# Patient Record
Sex: Female | Born: 1955 | Race: Black or African American | Hispanic: No | Smoking: Former smoker
Health system: Southern US, Community
[De-identification: ages and names within clinical notes are randomized; demographics above are authoritative.]

---

## 2007-09-08 ENCOUNTER — Other Ambulatory Visit: Admission: RE | Admit: 2007-09-08 | Discharge: 2007-09-08 | Payer: Self-pay | Admitting: Obstetrics and Gynecology

## 2007-09-29 ENCOUNTER — Encounter: Admission: RE | Admit: 2007-09-29 | Discharge: 2007-09-29 | Payer: Self-pay | Admitting: Family Medicine

## 2007-10-06 ENCOUNTER — Encounter: Admission: RE | Admit: 2007-10-06 | Discharge: 2007-10-06 | Payer: Self-pay | Admitting: Family Medicine

## 2009-06-13 ENCOUNTER — Encounter: Admission: RE | Admit: 2009-06-13 | Discharge: 2009-06-13 | Payer: Self-pay | Admitting: Family Medicine

## 2010-05-29 ENCOUNTER — Other Ambulatory Visit: Admission: RE | Admit: 2010-05-29 | Discharge: 2010-05-29 | Payer: Self-pay | Admitting: Obstetrics and Gynecology

## 2010-05-29 ENCOUNTER — Other Ambulatory Visit
Admission: RE | Admit: 2010-05-29 | Discharge: 2010-05-29 | Payer: Self-pay | Source: Home / Self Care | Admitting: Obstetrics and Gynecology

## 2010-09-16 ENCOUNTER — Encounter: Payer: Self-pay | Admitting: Family Medicine

## 2010-12-29 ENCOUNTER — Emergency Department (HOSPITAL_COMMUNITY)
Admission: EM | Admit: 2010-12-29 | Discharge: 2010-12-29 | Disposition: A | Discharge status: Home / Self Care | Payer: PRIVATE HEALTH INSURANCE | Attending: Emergency Medicine | Admitting: Emergency Medicine

## 2010-12-29 ENCOUNTER — Emergency Department (HOSPITAL_COMMUNITY): Payer: PRIVATE HEALTH INSURANCE

## 2010-12-29 DIAGNOSIS — M79609 Pain in unspecified limb: Secondary | ICD-10-CM | POA: Insufficient documentation

## 2010-12-29 DIAGNOSIS — I10 Essential (primary) hypertension: Secondary | ICD-10-CM | POA: Insufficient documentation

## 2010-12-29 DIAGNOSIS — M546 Pain in thoracic spine: Secondary | ICD-10-CM | POA: Insufficient documentation

## 2010-12-29 DIAGNOSIS — H1130 Conjunctival hemorrhage, unspecified eye: Secondary | ICD-10-CM | POA: Insufficient documentation

## 2011-06-04 ENCOUNTER — Other Ambulatory Visit (HOSPITAL_COMMUNITY)
Admission: RE | Admit: 2011-06-04 | Payer: PRIVATE HEALTH INSURANCE | Source: Ambulatory Visit | Admitting: Obstetrics and Gynecology

## 2011-06-04 ENCOUNTER — Other Ambulatory Visit (HOSPITAL_COMMUNITY)
Admission: RE | Admit: 2011-06-04 | Discharge: 2011-06-04 | Disposition: A | Discharge status: Home / Self Care | Payer: PRIVATE HEALTH INSURANCE | Source: Ambulatory Visit | Attending: Obstetrics and Gynecology | Admitting: Obstetrics and Gynecology

## 2011-06-04 DIAGNOSIS — Z01419 Encounter for gynecological examination (general) (routine) without abnormal findings: Secondary | ICD-10-CM | POA: Insufficient documentation

## 2011-06-07 ENCOUNTER — Other Ambulatory Visit: Payer: Self-pay | Admitting: Obstetrics and Gynecology

## 2011-06-07 DIAGNOSIS — Z1231 Encounter for screening mammogram for malignant neoplasm of breast: Secondary | ICD-10-CM

## 2011-07-09 ENCOUNTER — Ambulatory Visit
Admission: RE | Admit: 2011-07-09 | Discharge: 2011-07-09 | Disposition: A | Discharge status: Home / Self Care | Payer: PRIVATE HEALTH INSURANCE | Source: Ambulatory Visit | Attending: Obstetrics and Gynecology | Admitting: Obstetrics and Gynecology

## 2011-07-09 ENCOUNTER — Ambulatory Visit: Payer: No Typology Code available for payment source

## 2011-07-09 DIAGNOSIS — Z1231 Encounter for screening mammogram for malignant neoplasm of breast: Secondary | ICD-10-CM

## 2012-11-18 ENCOUNTER — Other Ambulatory Visit: Payer: Self-pay

## 2012-11-18 DIAGNOSIS — Z1231 Encounter for screening mammogram for malignant neoplasm of breast: Secondary | ICD-10-CM

## 2012-12-28 ENCOUNTER — Ambulatory Visit
Admission: RE | Admit: 2012-12-28 | Discharge: 2012-12-28 | Disposition: A | Discharge status: Home / Self Care | Payer: BC Managed Care – PPO | Source: Ambulatory Visit

## 2012-12-28 ENCOUNTER — Other Ambulatory Visit: Payer: Self-pay | Admitting: Obstetrics and Gynecology

## 2012-12-28 ENCOUNTER — Other Ambulatory Visit (HOSPITAL_COMMUNITY)
Admission: RE | Admit: 2012-12-28 | Discharge: 2012-12-28 | Disposition: A | Discharge status: Home / Self Care | Payer: BC Managed Care – PPO | Source: Ambulatory Visit | Attending: Obstetrics and Gynecology | Admitting: Obstetrics and Gynecology

## 2012-12-28 DIAGNOSIS — Z1151 Encounter for screening for human papillomavirus (HPV): Secondary | ICD-10-CM | POA: Insufficient documentation

## 2012-12-28 DIAGNOSIS — Z1231 Encounter for screening mammogram for malignant neoplasm of breast: Secondary | ICD-10-CM

## 2012-12-28 DIAGNOSIS — Z01419 Encounter for gynecological examination (general) (routine) without abnormal findings: Secondary | ICD-10-CM | POA: Insufficient documentation

## 2013-12-16 ENCOUNTER — Other Ambulatory Visit: Payer: Self-pay

## 2013-12-16 DIAGNOSIS — Z1231 Encounter for screening mammogram for malignant neoplasm of breast: Secondary | ICD-10-CM

## 2013-12-30 ENCOUNTER — Ambulatory Visit: Payer: BC Managed Care – PPO

## 2013-12-30 ENCOUNTER — Encounter (INDEPENDENT_AMBULATORY_CARE_PROVIDER_SITE_OTHER): Payer: Self-pay

## 2013-12-30 ENCOUNTER — Ambulatory Visit
Admission: RE | Admit: 2013-12-30 | Discharge: 2013-12-30 | Disposition: A | Discharge status: Home / Self Care | Payer: No Typology Code available for payment source | Source: Ambulatory Visit

## 2013-12-30 DIAGNOSIS — Z1231 Encounter for screening mammogram for malignant neoplasm of breast: Secondary | ICD-10-CM

## 2013-12-31 ENCOUNTER — Other Ambulatory Visit: Payer: Self-pay | Admitting: Internal Medicine

## 2013-12-31 DIAGNOSIS — R928 Other abnormal and inconclusive findings on diagnostic imaging of breast: Secondary | ICD-10-CM

## 2014-01-27 ENCOUNTER — Other Ambulatory Visit: Payer: No Typology Code available for payment source

## 2014-01-27 ENCOUNTER — Ambulatory Visit
Admission: RE | Admit: 2014-01-27 | Discharge: 2014-01-27 | Disposition: A | Discharge status: Home / Self Care | Payer: No Typology Code available for payment source | Source: Ambulatory Visit | Attending: Internal Medicine | Admitting: Internal Medicine

## 2014-01-27 DIAGNOSIS — R928 Other abnormal and inconclusive findings on diagnostic imaging of breast: Secondary | ICD-10-CM

## 2014-05-19 ENCOUNTER — Ambulatory Visit
Admission: RE | Admit: 2014-05-19 | Discharge: 2014-05-19 | Disposition: A | Discharge status: Home / Self Care | Payer: No Typology Code available for payment source | Source: Ambulatory Visit | Attending: Internal Medicine | Admitting: Internal Medicine

## 2014-05-19 ENCOUNTER — Other Ambulatory Visit: Payer: Self-pay | Admitting: Internal Medicine

## 2014-05-19 DIAGNOSIS — M25562 Pain in left knee: Secondary | ICD-10-CM

## 2014-05-19 DIAGNOSIS — M542 Cervicalgia: Secondary | ICD-10-CM

## 2015-01-31 ENCOUNTER — Other Ambulatory Visit: Payer: Self-pay

## 2015-01-31 DIAGNOSIS — Z1231 Encounter for screening mammogram for malignant neoplasm of breast: Secondary | ICD-10-CM

## 2015-02-24 ENCOUNTER — Ambulatory Visit: Payer: No Typology Code available for payment source

## 2015-03-28 ENCOUNTER — Ambulatory Visit
Admission: RE | Admit: 2015-03-28 | Discharge: 2015-03-28 | Disposition: A | Discharge status: Home / Self Care | Payer: 59 | Source: Ambulatory Visit

## 2015-03-28 DIAGNOSIS — Z1231 Encounter for screening mammogram for malignant neoplasm of breast: Secondary | ICD-10-CM

## 2016-03-13 ENCOUNTER — Ambulatory Visit
Admission: RE | Admit: 2016-03-13 | Discharge: 2016-03-13 | Disposition: A | Discharge status: Home / Self Care | Payer: BLUE CROSS/BLUE SHIELD | Source: Ambulatory Visit | Attending: Internal Medicine | Admitting: Internal Medicine

## 2016-03-13 ENCOUNTER — Other Ambulatory Visit: Payer: Self-pay | Admitting: Internal Medicine

## 2016-03-13 DIAGNOSIS — R059 Cough, unspecified: Secondary | ICD-10-CM

## 2016-03-13 DIAGNOSIS — R05 Cough: Secondary | ICD-10-CM

## 2016-04-04 ENCOUNTER — Other Ambulatory Visit (HOSPITAL_COMMUNITY)
Admission: RE | Admit: 2016-04-04 | Discharge: 2016-04-04 | Disposition: A | Discharge status: Home / Self Care | Payer: BLUE CROSS/BLUE SHIELD | Source: Ambulatory Visit | Attending: Nurse Practitioner | Admitting: Nurse Practitioner

## 2016-04-04 ENCOUNTER — Other Ambulatory Visit: Payer: Self-pay | Admitting: Nurse Practitioner

## 2016-04-04 DIAGNOSIS — Z1151 Encounter for screening for human papillomavirus (HPV): Secondary | ICD-10-CM | POA: Insufficient documentation

## 2016-04-04 DIAGNOSIS — Z01419 Encounter for gynecological examination (general) (routine) without abnormal findings: Secondary | ICD-10-CM | POA: Diagnosis present

## 2016-04-05 LAB — CYTOLOGY - PAP

## 2016-10-25 ENCOUNTER — Other Ambulatory Visit: Payer: Self-pay | Admitting: Nurse Practitioner

## 2016-10-25 ENCOUNTER — Other Ambulatory Visit: Payer: Self-pay | Admitting: Internal Medicine

## 2016-10-25 DIAGNOSIS — N63 Unspecified lump in unspecified breast: Secondary | ICD-10-CM

## 2016-10-25 DIAGNOSIS — R5381 Other malaise: Secondary | ICD-10-CM

## 2016-11-05 ENCOUNTER — Ambulatory Visit
Admission: RE | Admit: 2016-11-05 | Discharge: 2016-11-05 | Disposition: A | Discharge status: Home / Self Care | Payer: BLUE CROSS/BLUE SHIELD | Source: Ambulatory Visit | Attending: Nurse Practitioner | Admitting: Nurse Practitioner

## 2016-11-05 DIAGNOSIS — N63 Unspecified lump in unspecified breast: Secondary | ICD-10-CM

## 2017-07-24 ENCOUNTER — Other Ambulatory Visit: Payer: Self-pay | Admitting: Nurse Practitioner

## 2018-01-19 IMAGING — CR DG CHEST 2V
2 series · 2 of 2 positions shown · non-contrast
Comparison: Chest x-ray of February 05, 2011

CLINICAL DATA: Persistent nonproductive cough with occasional
production of phlegm for the past 2 months, current smoker, history
of hypertension.

EXAM:
CHEST  2 VIEW

[view not recorded (1 of 2)]
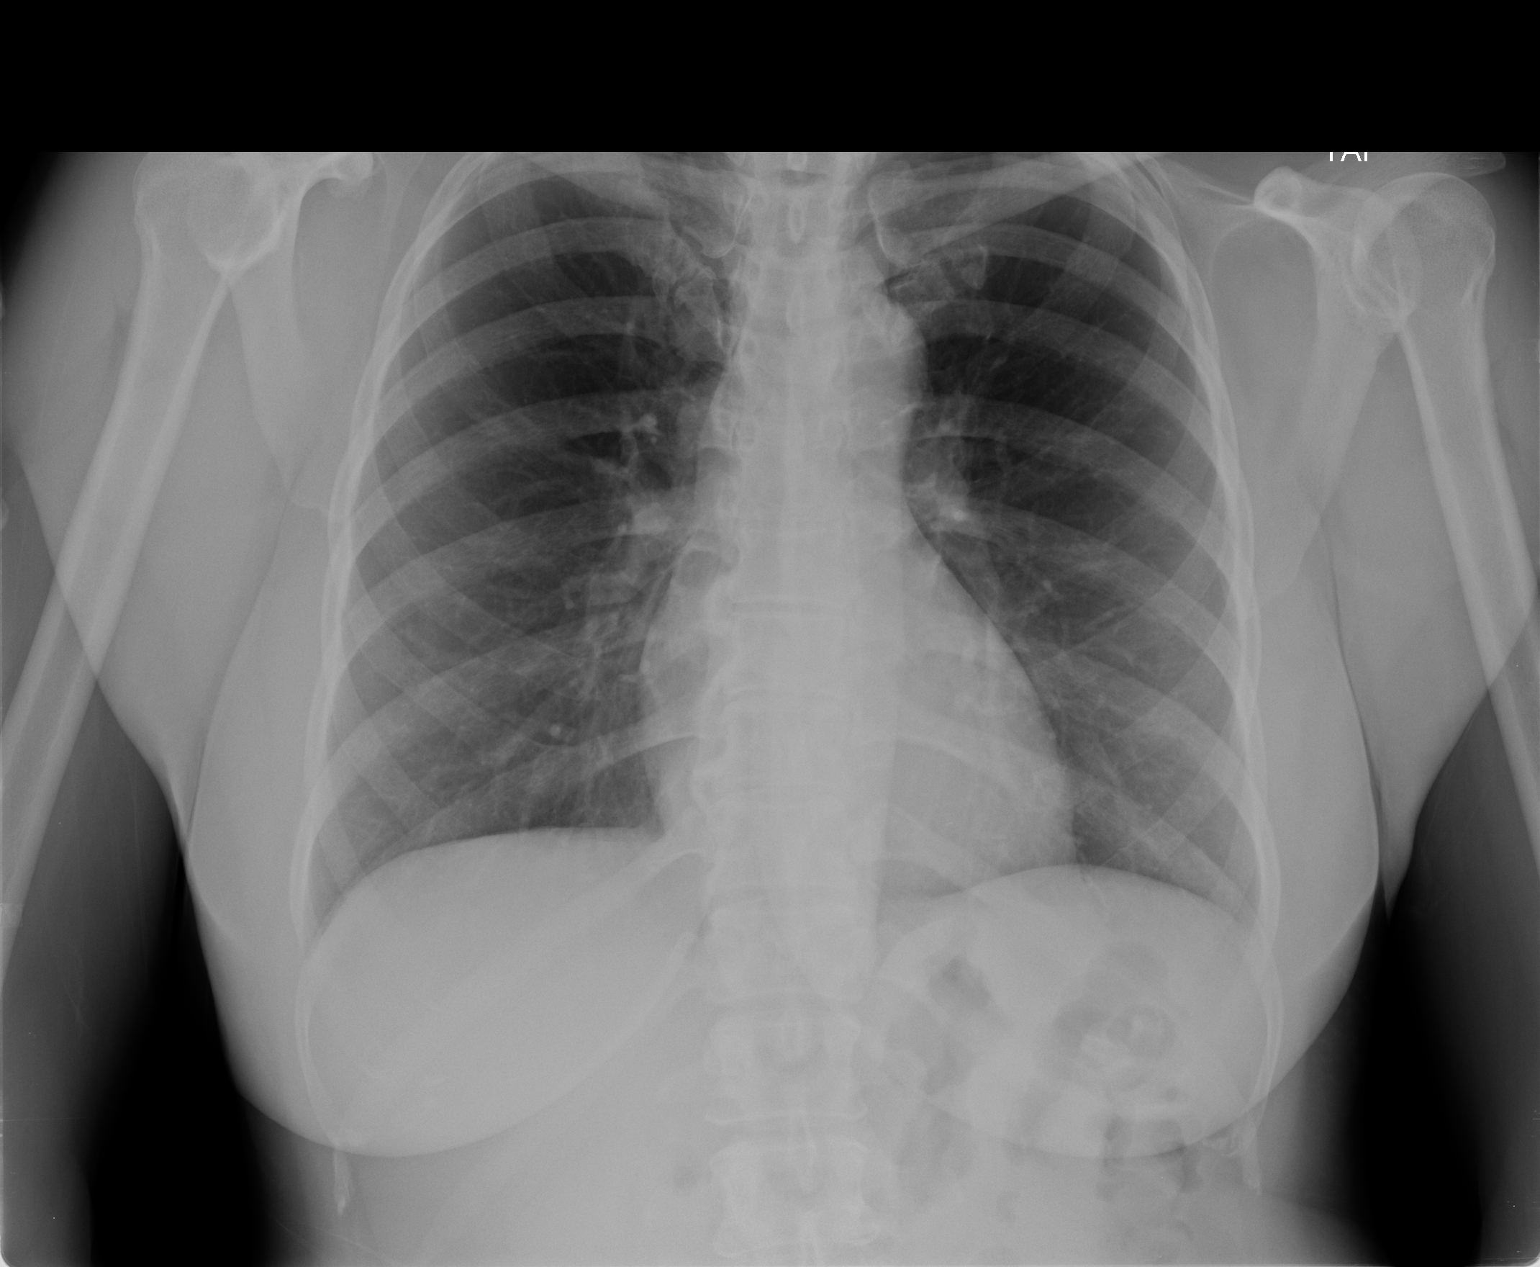

[view not recorded (2 of 2)]
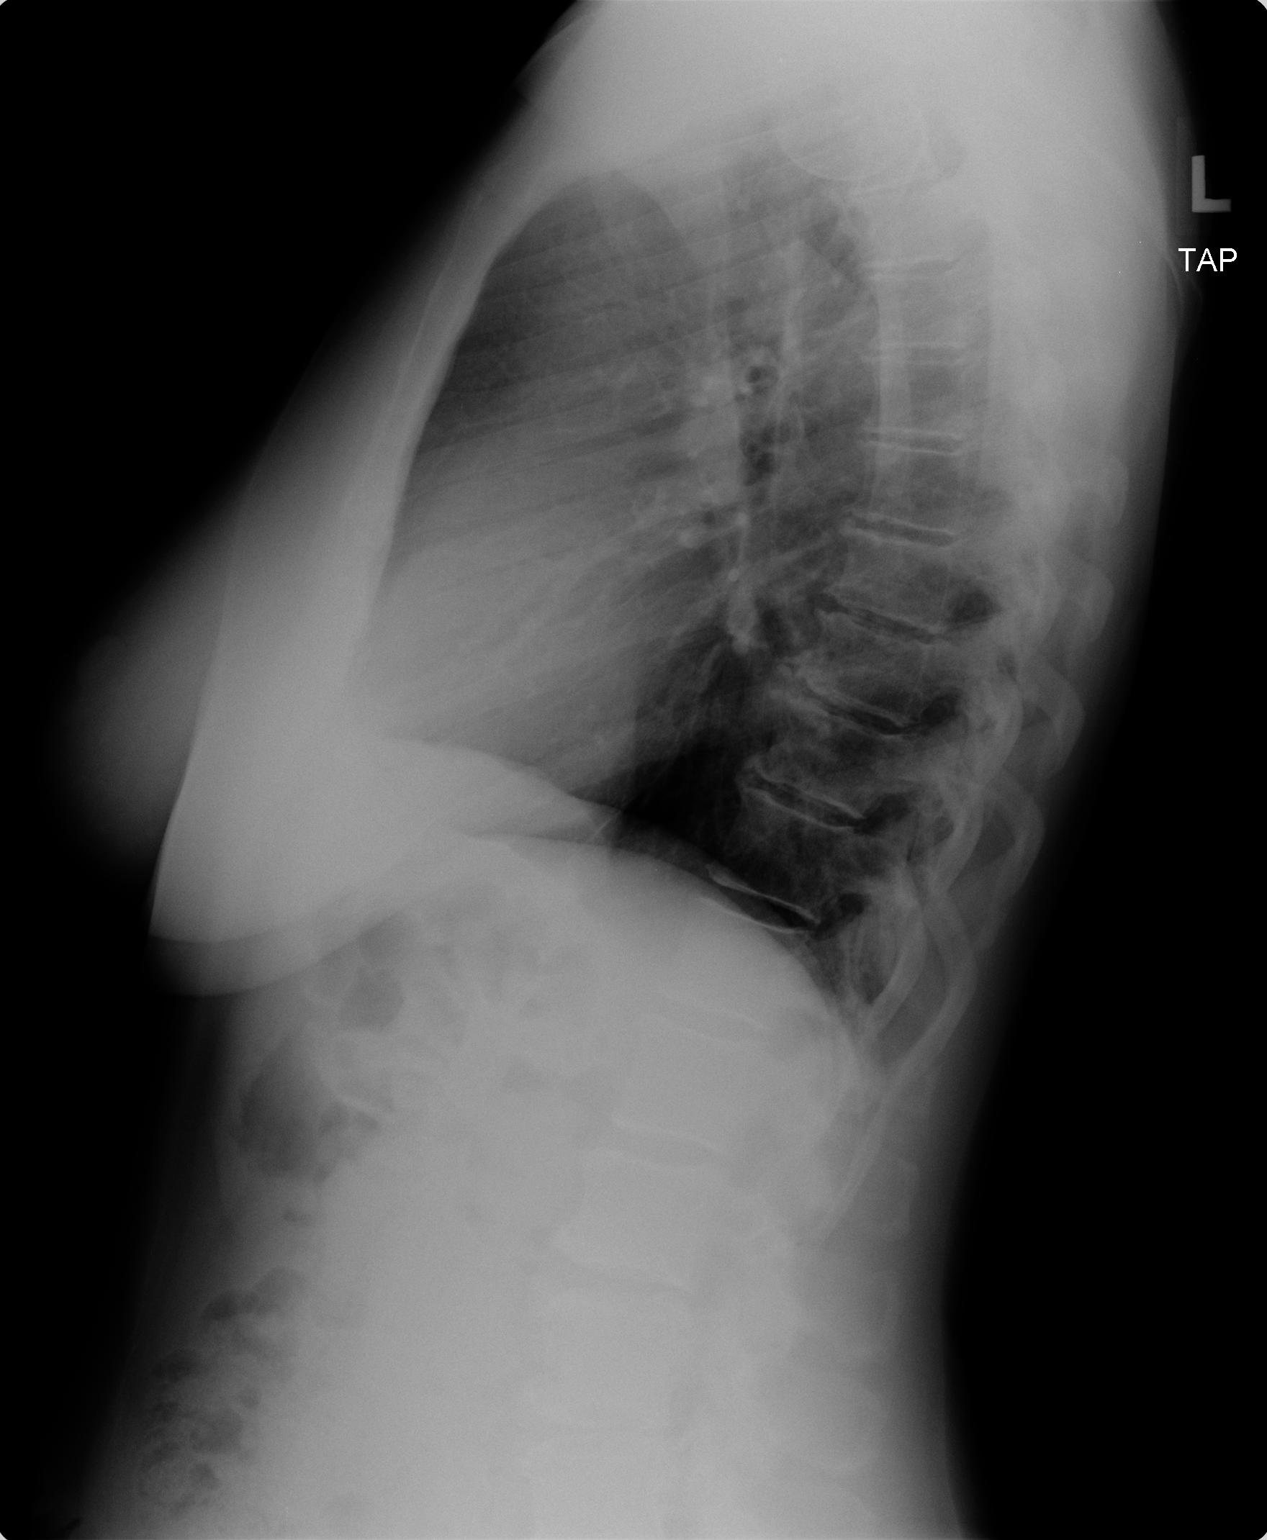

[2 of 2 positions shown; findings below may reference images not displayed]

FINDINGS: The lungs are mildly hyperinflated. There is no focal infiltrate.
The heart and pulmonary vascularity are normal. The mediastinum is
normal in width. There is no pleural effusion. There is
calcification in the wall of the thoracic aorta. The bony thorax is
unremarkable.
IMPRESSION: There is no pneumonia nor other acute cardiopulmonary abnormality.

Aortic atherosclerosis.

## 2018-03-24 ENCOUNTER — Other Ambulatory Visit: Payer: Self-pay | Admitting: Internal Medicine

## 2018-03-24 DIAGNOSIS — N6320 Unspecified lump in the left breast, unspecified quadrant: Secondary | ICD-10-CM

## 2018-03-27 ENCOUNTER — Other Ambulatory Visit: Payer: BLUE CROSS/BLUE SHIELD

## 2018-04-03 ENCOUNTER — Ambulatory Visit
Admission: RE | Admit: 2018-04-03 | Discharge: 2018-04-03 | Disposition: A | Discharge status: Home / Self Care | Payer: BLUE CROSS/BLUE SHIELD | Source: Ambulatory Visit | Attending: Internal Medicine | Admitting: Internal Medicine

## 2018-04-03 DIAGNOSIS — N6320 Unspecified lump in the left breast, unspecified quadrant: Secondary | ICD-10-CM

## 2018-08-04 ENCOUNTER — Other Ambulatory Visit (HOSPITAL_COMMUNITY)
Admission: RE | Admit: 2018-08-04 | Discharge: 2018-08-04 | Disposition: A | Discharge status: Home / Self Care | Payer: BLUE CROSS/BLUE SHIELD | Source: Ambulatory Visit | Attending: Nurse Practitioner | Admitting: Nurse Practitioner

## 2018-08-04 ENCOUNTER — Other Ambulatory Visit: Payer: Self-pay | Admitting: Nurse Practitioner

## 2018-08-04 DIAGNOSIS — Z01419 Encounter for gynecological examination (general) (routine) without abnormal findings: Secondary | ICD-10-CM | POA: Diagnosis present

## 2018-08-06 LAB — CYTOLOGY - PAP
Diagnosis: NEGATIVE
HPV: NOT DETECTED

## 2019-09-14 ENCOUNTER — Other Ambulatory Visit: Payer: Self-pay | Admitting: Internal Medicine

## 2019-09-14 DIAGNOSIS — Z1231 Encounter for screening mammogram for malignant neoplasm of breast: Secondary | ICD-10-CM

## 2019-10-13 ENCOUNTER — Ambulatory Visit: Payer: BLUE CROSS/BLUE SHIELD

## 2019-11-12 ENCOUNTER — Ambulatory Visit: Payer: Self-pay | Attending: Internal Medicine

## 2019-11-12 DIAGNOSIS — Z23 Encounter for immunization: Secondary | ICD-10-CM

## 2019-11-12 NOTE — Progress Notes (Signed)
   Covid-19 Vaccination Clinic  Name:  Kristen Todd    MRN: [id number] DOB: Feb 17, 1956  11/12/2019  Ms. [name] was observed post Covid-19 immunization for 15 minutes without incident. She was provided with Vaccine Information Sheet and instruction to access the V-Safe system.   Ms. [name] was instructed to call 911 with any severe reactions post vaccine: Marland Kitchen Difficulty breathing  . Swelling of face and throat  . A fast heartbeat  . A bad rash all over body  . Dizziness and weakness   Immunizations Administered    Name Date Dose VIS Date Route   Pfizer COVID-19 Vaccine 11/12/2019 11:30 AM 0.3 mL 08/06/2019 Intramuscular   Manufacturer: [place]   Lot: [id number]   [place]: [id number]

## 2019-11-20 ENCOUNTER — Ambulatory Visit: Payer: Self-pay

## 2019-12-08 ENCOUNTER — Ambulatory Visit: Payer: Self-pay | Attending: Internal Medicine

## 2019-12-08 DIAGNOSIS — Z23 Encounter for immunization: Secondary | ICD-10-CM

## 2019-12-08 NOTE — Progress Notes (Signed)
   Covid-19 Vaccination Clinic  Name:  Kristen Todd    MRN: [id number] DOB: [birth year]  12/08/2019  [name] was observed post Covid-19 immunization for 15 minutes without incident. She was provided with Vaccine Information Sheet and instruction to access the V-Safe system.   [name] was instructed to call 911 with any severe reactions post vaccine: Marland Kitchen Difficulty breathing  . Swelling of face and throat  . A fast heartbeat  . A bad rash all over body  . Dizziness and weakness   Immunizations Administered    Name Date Dose VIS Date Route   Pfizer COVID-19 Vaccine 12/08/2019  9:50 AM 0.3 mL 08/06/2019 Intramuscular   Manufacturer: [place]   Lot: [id number]   [place]: [id number]

## 2020-07-25 ENCOUNTER — Ambulatory Visit: Payer: Self-pay | Attending: Internal Medicine

## 2020-07-25 DIAGNOSIS — Z23 Encounter for immunization: Secondary | ICD-10-CM

## 2020-07-25 NOTE — Progress Notes (Signed)
   Covid-19 Vaccination Clinic  Name:  Kristen Todd    MRN: [id number] DOB: 04/23/1956  07/25/2020  [name] was observed post Covid-19 immunization for 15 minutes without incident. She was provided with Vaccine Information Sheet and instruction to access the V-Safe system.   [name] was instructed to call 911 with any severe reactions post vaccine: Marland Kitchen Difficulty breathing  . Swelling of face and throat  . A fast heartbeat  . A bad rash all over body  . Dizziness and weakness   Immunizations Administered    Name Date Dose VIS Date Route   Pfizer COVID-19 Vaccine 07/25/2020  3:04 PM 0.3 mL 06/14/2020 Intramuscular   Manufacturer: [place]   Lot: [id number]   [place]: [id number]

## 2020-09-14 ENCOUNTER — Other Ambulatory Visit: Payer: Self-pay

## 2020-09-14 ENCOUNTER — Ambulatory Visit
Admission: RE | Admit: 2020-09-14 | Discharge: 2020-09-14 | Disposition: A | Discharge status: Home / Self Care | Payer: Self-pay | Source: Ambulatory Visit | Attending: Internal Medicine | Admitting: Internal Medicine

## 2020-09-14 DIAGNOSIS — Z1231 Encounter for screening mammogram for malignant neoplasm of breast: Secondary | ICD-10-CM

## 2021-03-30 ENCOUNTER — Other Ambulatory Visit: Payer: Self-pay | Admitting: Physician Assistant

## 2021-03-30 DIAGNOSIS — R1310 Dysphagia, unspecified: Secondary | ICD-10-CM

## 2021-04-09 ENCOUNTER — Other Ambulatory Visit: Payer: 59

## 2021-04-10 ENCOUNTER — Ambulatory Visit
Admission: RE | Admit: 2021-04-10 | Discharge: 2021-04-10 | Disposition: A | Discharge status: Home / Self Care | Payer: 59 | Source: Ambulatory Visit | Attending: Physician Assistant | Admitting: Physician Assistant

## 2021-04-10 ENCOUNTER — Other Ambulatory Visit: Payer: Self-pay

## 2021-04-10 ENCOUNTER — Other Ambulatory Visit: Payer: Self-pay | Admitting: Physician Assistant

## 2021-04-10 DIAGNOSIS — R1310 Dysphagia, unspecified: Secondary | ICD-10-CM

## 2021-10-04 DIAGNOSIS — E782 Mixed hyperlipidemia: Secondary | ICD-10-CM | POA: Diagnosis not present

## 2021-10-04 DIAGNOSIS — Z5181 Encounter for therapeutic drug level monitoring: Secondary | ICD-10-CM | POA: Diagnosis not present

## 2021-10-11 ENCOUNTER — Other Ambulatory Visit: Payer: Self-pay | Admitting: Internal Medicine

## 2021-10-11 DIAGNOSIS — Z1231 Encounter for screening mammogram for malignant neoplasm of breast: Secondary | ICD-10-CM

## 2021-10-30 ENCOUNTER — Ambulatory Visit
Admission: RE | Admit: 2021-10-30 | Discharge: 2021-10-30 | Disposition: A | Discharge status: Home / Self Care | Payer: 59 | Source: Ambulatory Visit | Attending: Internal Medicine | Admitting: Internal Medicine

## 2021-10-30 ENCOUNTER — Ambulatory Visit: Payer: 59 | Admitting: Podiatry

## 2021-10-30 ENCOUNTER — Ambulatory Visit (INDEPENDENT_AMBULATORY_CARE_PROVIDER_SITE_OTHER): Payer: 59

## 2021-10-30 ENCOUNTER — Other Ambulatory Visit: Payer: Self-pay

## 2021-10-30 ENCOUNTER — Encounter: Payer: Self-pay | Admitting: Podiatry

## 2021-10-30 DIAGNOSIS — Z1231 Encounter for screening mammogram for malignant neoplasm of breast: Secondary | ICD-10-CM | POA: Diagnosis not present

## 2021-10-30 DIAGNOSIS — R1310 Dysphagia, unspecified: Secondary | ICD-10-CM | POA: Insufficient documentation

## 2021-10-30 DIAGNOSIS — N951 Menopausal and female climacteric states: Secondary | ICD-10-CM | POA: Insufficient documentation

## 2021-10-30 DIAGNOSIS — M2011 Hallux valgus (acquired), right foot: Secondary | ICD-10-CM | POA: Diagnosis not present

## 2021-10-30 DIAGNOSIS — F418 Other specified anxiety disorders: Secondary | ICD-10-CM | POA: Insufficient documentation

## 2021-10-30 DIAGNOSIS — D2371 Other benign neoplasm of skin of right lower limb, including hip: Secondary | ICD-10-CM | POA: Diagnosis not present

## 2021-10-30 DIAGNOSIS — K5730 Diverticulosis of large intestine without perforation or abscess without bleeding: Secondary | ICD-10-CM | POA: Insufficient documentation

## 2021-10-30 DIAGNOSIS — J309 Allergic rhinitis, unspecified: Secondary | ICD-10-CM | POA: Insufficient documentation

## 2021-10-30 DIAGNOSIS — M2041 Other hammer toe(s) (acquired), right foot: Secondary | ICD-10-CM

## 2021-10-30 DIAGNOSIS — N950 Postmenopausal bleeding: Secondary | ICD-10-CM | POA: Insufficient documentation

## 2021-10-30 DIAGNOSIS — N182 Chronic kidney disease, stage 2 (mild): Secondary | ICD-10-CM | POA: Insufficient documentation

## 2021-10-30 DIAGNOSIS — E782 Mixed hyperlipidemia: Secondary | ICD-10-CM | POA: Insufficient documentation

## 2021-10-30 DIAGNOSIS — M21619 Bunion of unspecified foot: Secondary | ICD-10-CM | POA: Insufficient documentation

## 2021-10-30 DIAGNOSIS — I10 Essential (primary) hypertension: Secondary | ICD-10-CM | POA: Insufficient documentation

## 2021-10-30 DIAGNOSIS — Z1211 Encounter for screening for malignant neoplasm of colon: Secondary | ICD-10-CM | POA: Insufficient documentation

## 2021-10-30 DIAGNOSIS — E7800 Pure hypercholesterolemia, unspecified: Secondary | ICD-10-CM | POA: Insufficient documentation

## 2021-10-30 DIAGNOSIS — F329 Major depressive disorder, single episode, unspecified: Secondary | ICD-10-CM | POA: Insufficient documentation

## 2021-10-30 DIAGNOSIS — D259 Leiomyoma of uterus, unspecified: Secondary | ICD-10-CM | POA: Insufficient documentation

## 2021-10-30 DIAGNOSIS — N926 Irregular menstruation, unspecified: Secondary | ICD-10-CM | POA: Insufficient documentation

## 2021-10-30 NOTE — Progress Notes (Signed)
?Subjective:  ?Patient ID: Kristen Todd, female    DOB: 10-Jun-1956,  MRN: [id number] ?HPI ?Chief Complaint  ?Patient presents with  ? Foot Pain  ?  1st MPJ and 2nd toe right - bunion and HT deformity x years, developing calluses from rubbing other toes and shoe gear  ? New Patient (Initial Visit)  ? ? ?66 y.o. female presents with the above complaint.  ? ?ROS: Denies fever chills nausea vomiting muscle aches pains calf pain back pain chest pain shortness of breath. ? ?No past medical history on file. ?No past surgical history on file. ? ?Current Outpatient Medications:  ?  hydrocortisone (ANUSOL-HC) 2.5 % rectal cream, 1 application, Disp: , Rfl:  ?  ALPRAZolam (XANAX) 0.5 MG tablet, Take 0.5 mg by mouth at bedtime as needed., Disp: , Rfl:  ?  amLODipine (NORVASC) 10 MG tablet, Take 10 mg by mouth daily., Disp: , Rfl:  ?  atorvastatin (LIPITOR) 10 MG tablet, Take 10 mg by mouth daily., Disp: , Rfl:  ?  Flaxseed, Linseed, (FLAX SEED OIL) 1000 MG CAPS, See admin instructions., Disp: , Rfl:  ?  levocetirizine (XYZAL) 5 MG tablet, every evening., Disp: , Rfl:  ?  Magnesium 200 MG TABS, 1 tablet, Disp: , Rfl:  ?  MIMVEY 1-0.5 MG tablet, Take 1 tablet by mouth daily., Disp: , Rfl:  ?  Omega-3 Fatty Acids (FISH OIL) 1000 MG CAPS, 1 capsule, Disp: , Rfl:  ?  vitamin C (ASCORBIC ACID) 500 MG tablet, 1 tablet, Disp: , Rfl:  ? ?Allergies  ?Allergen Reactions  ? Latex   ?  Other reaction(s): itching  ? ?Review of Systems ?Objective:  ?There were no vitals filed for this visit. ? ?General: Well developed, nourished, in no acute distress, alert and oriented x3  ? ?Dermatological: Skin is warm, dry and supple bilateral. Nails x 10 are well maintained; remaining integument appears unremarkable at this time. There are no open sores, no preulcerative lesions, no rash or signs of infection present. ? ?Vascular: Dorsalis Pedis artery and Posterior Tibial artery pedal pulses are 2/4 bilateral with immedate capillary fill time. Pedal  hair growth present. No varicosities and no lower extremity edema present bilateral.  ? ?Neruologic: Grossly intact via light touch bilateral. Vibratory intact via tuning fork bilateral. Protective threshold with Semmes Wienstein monofilament intact to all pedal sites bilateral. Patellar and Achilles deep tendon reflexes 2+ bilateral. No Babinski or clonus noted bilateral.  ? ?Musculoskeletal: No gross boney pedal deformities bilateral. No pain, crepitus, or limitation noted with foot and ankle range of motion bilateral. Muscular strength 5/5 in all groups tested bilateral.  He has mild hallux valgus of the right foot and a painful PIPJ second digit of the right foot secondary to previous surgery and arthritis.  There is considerable's scarring around t the first metatarsal phalangeal joint area. ? ?Gait: Unassisted, Nonantalgic.  ? ? ?Radiographs: ? ?Radiographs of the right foot taken today demonstrate an osseously mature individual with significant osteoarthritic changes cystic and degenerative around the first metatarsal phalangeal joint interphalangeal joint and PIPJ of the second digit right foot.  No fractures identified no acute findings.   ? ?Assessment & Plan:  ? 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 ?Plan: Discussed etiology pathology conservative surgical therapies with her today we did not consent her today she is going to think about this.  I did discuss possible complications associated with the multiple surgeries that are associated  with this foot and that have been in the past.  I explained to her that I would worry about healing of not only the bone bit of the skin and possible loss of digit or limb.  I will follow-up with her in the future for further discussion ? ? ? ? ?Hiawatha Dressel T. [place], DPM ?

## 2021-10-31 ENCOUNTER — Other Ambulatory Visit: Payer: Self-pay | Admitting: Internal Medicine

## 2021-10-31 DIAGNOSIS — R928 Other abnormal and inconclusive findings on diagnostic imaging of breast: Secondary | ICD-10-CM

## 2022-01-02 ENCOUNTER — Other Ambulatory Visit: Payer: 59

## 2022-02-18 DIAGNOSIS — Z79890 Hormone replacement therapy (postmenopausal): Secondary | ICD-10-CM | POA: Diagnosis not present

## 2022-02-18 DIAGNOSIS — Z124 Encounter for screening for malignant neoplasm of cervix: Secondary | ICD-10-CM | POA: Diagnosis not present

## 2022-02-18 DIAGNOSIS — R69 Illness, unspecified: Secondary | ICD-10-CM | POA: Diagnosis not present

## 2022-02-18 DIAGNOSIS — Z01419 Encounter for gynecological examination (general) (routine) without abnormal findings: Secondary | ICD-10-CM | POA: Diagnosis not present

## 2023-03-18 DIAGNOSIS — Z01419 Encounter for gynecological examination (general) (routine) without abnormal findings: Secondary | ICD-10-CM | POA: Diagnosis not present

## 2023-03-18 DIAGNOSIS — Z1151 Encounter for screening for human papillomavirus (HPV): Secondary | ICD-10-CM | POA: Diagnosis not present

## 2023-03-18 DIAGNOSIS — Z124 Encounter for screening for malignant neoplasm of cervix: Secondary | ICD-10-CM | POA: Diagnosis not present

## 2023-06-09 ENCOUNTER — Other Ambulatory Visit: Payer: Self-pay | Admitting: Internal Medicine

## 2023-06-09 ENCOUNTER — Ambulatory Visit
Admission: RE | Admit: 2023-06-09 | Discharge: 2023-06-09 | Disposition: A | Discharge status: Home / Self Care | Payer: Medicare HMO | Source: Ambulatory Visit | Attending: Internal Medicine | Admitting: Internal Medicine

## 2023-06-09 DIAGNOSIS — Z23 Encounter for immunization: Secondary | ICD-10-CM | POA: Diagnosis not present

## 2023-06-09 DIAGNOSIS — R059 Cough, unspecified: Secondary | ICD-10-CM | POA: Diagnosis not present

## 2023-06-09 DIAGNOSIS — J309 Allergic rhinitis, unspecified: Secondary | ICD-10-CM | POA: Diagnosis not present

## 2023-06-09 DIAGNOSIS — K219 Gastro-esophageal reflux disease without esophagitis: Secondary | ICD-10-CM | POA: Diagnosis not present

## 2023-06-09 DIAGNOSIS — N1831 Chronic kidney disease, stage 3a: Secondary | ICD-10-CM | POA: Diagnosis not present

## 2023-06-24 DIAGNOSIS — H5203 Hypermetropia, bilateral: Secondary | ICD-10-CM | POA: Diagnosis not present

## 2023-06-24 DIAGNOSIS — H524 Presbyopia: Secondary | ICD-10-CM | POA: Diagnosis not present

## 2023-06-24 DIAGNOSIS — H2511 Age-related nuclear cataract, right eye: Secondary | ICD-10-CM | POA: Diagnosis not present

## 2023-06-24 DIAGNOSIS — H52209 Unspecified astigmatism, unspecified eye: Secondary | ICD-10-CM | POA: Diagnosis not present

## 2023-06-24 DIAGNOSIS — H2512 Age-related nuclear cataract, left eye: Secondary | ICD-10-CM | POA: Diagnosis not present

## 2023-10-14 ENCOUNTER — Other Ambulatory Visit: Payer: Self-pay | Admitting: Internal Medicine

## 2023-10-14 DIAGNOSIS — Z0000 Encounter for general adult medical examination without abnormal findings: Secondary | ICD-10-CM | POA: Diagnosis not present

## 2023-10-14 DIAGNOSIS — E2839 Other primary ovarian failure: Secondary | ICD-10-CM

## 2023-10-14 DIAGNOSIS — M2031 Hallux varus (acquired), right foot: Secondary | ICD-10-CM | POA: Diagnosis not present

## 2023-10-14 DIAGNOSIS — Z1389 Encounter for screening for other disorder: Secondary | ICD-10-CM | POA: Diagnosis not present

## 2023-10-14 DIAGNOSIS — I10 Essential (primary) hypertension: Secondary | ICD-10-CM | POA: Diagnosis not present

## 2023-10-14 DIAGNOSIS — Z23 Encounter for immunization: Secondary | ICD-10-CM | POA: Diagnosis not present

## 2023-10-14 DIAGNOSIS — F325 Major depressive disorder, single episode, in full remission: Secondary | ICD-10-CM | POA: Diagnosis not present

## 2023-10-14 DIAGNOSIS — N1831 Chronic kidney disease, stage 3a: Secondary | ICD-10-CM | POA: Diagnosis not present

## 2023-10-14 DIAGNOSIS — E782 Mixed hyperlipidemia: Secondary | ICD-10-CM | POA: Diagnosis not present

## 2023-10-21 ENCOUNTER — Other Ambulatory Visit: Payer: Self-pay | Admitting: Internal Medicine

## 2023-10-21 DIAGNOSIS — N6489 Other specified disorders of breast: Secondary | ICD-10-CM

## 2023-10-29 ENCOUNTER — Other Ambulatory Visit: Payer: Self-pay | Admitting: Internal Medicine

## 2023-10-29 DIAGNOSIS — R928 Other abnormal and inconclusive findings on diagnostic imaging of breast: Secondary | ICD-10-CM

## 2023-11-03 ENCOUNTER — Other Ambulatory Visit: Payer: Medicare HMO

## 2023-11-24 ENCOUNTER — Ambulatory Visit
Admission: RE | Admit: 2023-11-24 | Discharge: 2023-11-24 | Disposition: A | Discharge status: Home / Self Care | Source: Ambulatory Visit | Attending: Internal Medicine | Admitting: Internal Medicine

## 2023-11-24 ENCOUNTER — Ambulatory Visit

## 2023-11-24 DIAGNOSIS — R928 Other abnormal and inconclusive findings on diagnostic imaging of breast: Secondary | ICD-10-CM | POA: Diagnosis not present

## 2024-01-15 DIAGNOSIS — L639 Alopecia areata, unspecified: Secondary | ICD-10-CM | POA: Diagnosis not present

## 2024-01-15 DIAGNOSIS — L659 Nonscarring hair loss, unspecified: Secondary | ICD-10-CM | POA: Diagnosis not present

## 2024-02-10 DIAGNOSIS — L738 Other specified follicular disorders: Secondary | ICD-10-CM | POA: Diagnosis not present

## 2024-03-09 DIAGNOSIS — L509 Urticaria, unspecified: Secondary | ICD-10-CM | POA: Diagnosis not present

## 2024-03-09 DIAGNOSIS — L738 Other specified follicular disorders: Secondary | ICD-10-CM | POA: Diagnosis not present

## 2024-03-19 ENCOUNTER — Ambulatory Visit: Admitting: Podiatry

## 2024-03-19 ENCOUNTER — Ambulatory Visit (INDEPENDENT_AMBULATORY_CARE_PROVIDER_SITE_OTHER)

## 2024-03-19 ENCOUNTER — Encounter: Payer: Self-pay | Admitting: Podiatry

## 2024-03-19 DIAGNOSIS — M21619 Bunion of unspecified foot: Secondary | ICD-10-CM

## 2024-03-19 DIAGNOSIS — M2041 Other hammer toe(s) (acquired), right foot: Secondary | ICD-10-CM | POA: Diagnosis not present

## 2024-03-19 DIAGNOSIS — M2042 Other hammer toe(s) (acquired), left foot: Secondary | ICD-10-CM | POA: Diagnosis not present

## 2024-03-19 DIAGNOSIS — M21611 Bunion of right foot: Secondary | ICD-10-CM

## 2024-03-21 NOTE — Progress Notes (Signed)
 Subjective:   Patient ID: Kristen Todd, female   DOB: 68 y.o.   MRN: [id number]   HPI Patient states she is developing more pain in her right foot after having had surgery done approximately 20 years ago and patient desires correction of deformity as it is becoming harder to wear shoe gear with any degree of comfort.  Patient has no changes in health history   ROS      Objective:  Physical Exam  Neurovascular status was found to be intact with excellent pulses bilateral warm digits good digital perfusion noted.  Patient is noted to have an elevated second digit right foot that is elongated and becoming more tender for her with history of the left 1 having been corrected and having also had bunion deformity corrected bilateral with moderate reoccurrence on the right with deviation of the big toe against the second toe causing it to elevate.  Patient is also noted to have lesion on the fifth digit right foot painful when pressed     Assessment:  HAV deformity right with prominent bump and history of surgery with deviated right hallux and elevated second toe rotated fifth toe with keratotic lesion all painful.  Patient has tried wider shoes she has tried trimming techniques and she has tried cushions     Plan:  Reviewed x-rays and condition at great length.  Discussed alternatives for this and at this point patient  is motivated for surgery.  I have recommended due to the fact that previous surgery seemed to cause some damage to the first metatarsal head and she does have good range of motion of it with no crepitus that we just reduce the medial eminence which is sore and we released the abductor tendon I will then do Aiken osteotomy to straighten the big toe shortening and straighten the second toe and I will de rotate the fifth digit.  I explained all procedures and risk and patient wants surgery.  He read consent form going over alternative treatments complications and is scheduled for  outpatient surgery at this time for the above condition with all questions answered and encouraged to call with any questions.  I also dispensed air fracture walker to immobilize and take all pressure off the foot  X-rays indicate that there is abnormal seeing the first metatarsal head right but it does seem to function well with the medial eminence on the side.  Elongated second toe right that is also elevated and rotated fifth digit and big toe pressing against the second toe with structural hallux interphalange ES

## 2024-03-22 ENCOUNTER — Telehealth: Payer: Self-pay | Admitting: Podiatry

## 2024-03-22 NOTE — Telephone Encounter (Signed)
 Pt is scheduled for surgery on 9/9 per her request.  I did verify and change pharmacy to correct one. She is not on any blood thinners or GLP1 medications.  She would like you to call her as she has some questions about the procedure she is having.

## 2024-03-22 NOTE — Telephone Encounter (Signed)
 Pt left message asking for a call back to get her surgery scheduled.  I returned call and she was asking for surgery on a Monday and I explained Dr Magdalen does his surgeries on Tuesdays. She is calling her transportation and will call me back to get scheduled.

## 2024-03-24 NOTE — Telephone Encounter (Signed)
 done

## 2024-03-25 DIAGNOSIS — Z01419 Encounter for gynecological examination (general) (routine) without abnormal findings: Secondary | ICD-10-CM | POA: Diagnosis not present

## 2024-04-06 DIAGNOSIS — L738 Other specified follicular disorders: Secondary | ICD-10-CM | POA: Diagnosis not present

## 2024-04-06 DIAGNOSIS — L501 Idiopathic urticaria: Secondary | ICD-10-CM | POA: Diagnosis not present

## 2024-04-16 ENCOUNTER — Telehealth: Payer: Self-pay | Admitting: Podiatry

## 2024-04-16 NOTE — Telephone Encounter (Signed)
 DOS- 05/04/2024  AIKEN OSTEOTOMY RT- 71689 KELLER/MCBRIDE BUNIONECTOMY RT- 71707 2ND/5TH HAMMERTOE REPAIR RT- 71714  [employer] EFFECTIVE DATE- 09/27/2023  DEDUCTIBLE- $257 REMAINING- $0 OOP- $9350 REMAINING- $1141.77 COINSURANCE- 20%/$257  PER [place] WEBSITE, PRIOR AUTHS FOR CPT CODES 71689, [id number], AND [phone] HAVE BEEN APPROVED THROUGH 05/04/2024-08/03/2024. AUTH# [id number]

## 2024-04-19 ENCOUNTER — Telehealth: Payer: Self-pay | Admitting: Podiatry

## 2024-04-19 NOTE — Telephone Encounter (Signed)
 Pt called and canceled her surgery for 9/9 and is to call to r./s  I notified surgery center

## 2024-05-10 ENCOUNTER — Encounter: Admitting: Podiatry

## 2024-05-24 ENCOUNTER — Encounter: Admitting: Podiatry

## 2024-06-08 DIAGNOSIS — L738 Other specified follicular disorders: Secondary | ICD-10-CM | POA: Diagnosis not present

## 2024-06-17 ENCOUNTER — Other Ambulatory Visit: Payer: Medicare HMO

## 2024-07-08 ENCOUNTER — Other Ambulatory Visit (HOSPITAL_BASED_OUTPATIENT_CLINIC_OR_DEPARTMENT_OTHER)

## 2024-11-25 ENCOUNTER — Other Ambulatory Visit (HOSPITAL_BASED_OUTPATIENT_CLINIC_OR_DEPARTMENT_OTHER)
